# Patient Record
Sex: Female | Born: 2000 | Race: Black or African American | Hispanic: No | Marital: Single | Smoking: Never smoker
Health system: Southern US, Community
[De-identification: ages and names within clinical notes are randomized; demographics above are authoritative.]

## PROBLEM LIST (undated history)

## (undated) DIAGNOSIS — J302 Other seasonal allergic rhinitis: Secondary | ICD-10-CM

## (undated) DIAGNOSIS — J45909 Unspecified asthma, uncomplicated: Secondary | ICD-10-CM

---
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
## 2020-07-01 ENCOUNTER — Emergency Department (HOSPITAL_COMMUNITY): Payer: Medicaid Other

## 2020-07-01 ENCOUNTER — Encounter (HOSPITAL_COMMUNITY): Payer: Self-pay | Admitting: Emergency Medicine

## 2020-07-01 ENCOUNTER — Other Ambulatory Visit: Payer: Self-pay

## 2020-07-01 ENCOUNTER — Emergency Department (HOSPITAL_COMMUNITY)
Admission: EM | Admit: 2020-07-01 | Discharge: 2020-07-01 | Disposition: A | Discharge status: Home / Self Care | Payer: Medicaid Other | Attending: Emergency Medicine | Admitting: Emergency Medicine

## 2020-07-01 DIAGNOSIS — S61215A Laceration without foreign body of left ring finger without damage to nail, initial encounter: Secondary | ICD-10-CM | POA: Diagnosis not present

## 2020-07-01 DIAGNOSIS — W25XXXA Contact with sharp glass, initial encounter: Secondary | ICD-10-CM | POA: Insufficient documentation

## 2020-07-01 DIAGNOSIS — Z23 Encounter for immunization: Secondary | ICD-10-CM | POA: Diagnosis not present

## 2020-07-01 DIAGNOSIS — S60945A Unspecified superficial injury of left ring finger, initial encounter: Secondary | ICD-10-CM | POA: Diagnosis present

## 2020-07-01 DIAGNOSIS — J45909 Unspecified asthma, uncomplicated: Secondary | ICD-10-CM | POA: Diagnosis not present

## 2020-07-01 MED ORDER — IBUPROFEN 200 MG PO TABS
600.0000 mg | ORAL_TABLET | Freq: Once | ORAL | Status: AC
Start: 2020-07-01 — End: 2020-07-01
  Administered 2020-07-01: 600 mg via ORAL
  Filled 2020-07-01: qty 3

## 2020-07-01 MED ORDER — TETANUS-DIPHTH-ACELL PERTUSSIS 5-2.5-18.5 LF-MCG/0.5 IM SUSY
0.5000 mL | PREFILLED_SYRINGE | Freq: Once | INTRAMUSCULAR | Status: AC
  Administered 2020-07-01: 0.5 mL via INTRAMUSCULAR
  Filled 2020-07-01: qty 0.5

## 2020-07-01 NOTE — ED Notes (Signed)
Pt continues to have bleeding from the left ring finger despite tourniquet and quick clot. PA-C notified and aware. PA-C at the bedside to evaluate at this time.
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
## 2020-07-01 NOTE — Discharge Instructions (Addendum)
Please keep the area clean and dry with soap and water.  Keep the area covered.  Change bandages daily.  Watch for signs of infection including worsening redness, swelling, discharge, fevers, chills, or any other new or concerning symptoms seek medical help if you experience these.  Return to the ER for any new or worsening symptoms.
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
